# Patient Record
Sex: Male | Born: 1975 | Hispanic: No | Marital: Married | Smoking: Never smoker
Health system: Southern US, Community
[De-identification: ages and names within clinical notes are randomized; demographics above are authoritative.]

## PROBLEM LIST (undated history)

## (undated) DIAGNOSIS — E119 Type 2 diabetes mellitus without complications: Secondary | ICD-10-CM

## (undated) DIAGNOSIS — K529 Noninfective gastroenteritis and colitis, unspecified: Secondary | ICD-10-CM

## (undated) DIAGNOSIS — E785 Hyperlipidemia, unspecified: Secondary | ICD-10-CM

## (undated) HISTORY — DX: Hyperlipidemia, unspecified: E78.5

## (undated) HISTORY — DX: Noninfective gastroenteritis and colitis, unspecified: K52.9

## (undated) HISTORY — DX: Type 2 diabetes mellitus without complications: E11.9

---

## 2008-04-15 ENCOUNTER — Encounter: Admission: RE | Admit: 2008-04-15 | Discharge: 2008-04-15 | Payer: Self-pay | Admitting: Family Medicine

## 2010-08-06 IMAGING — US US SOFT TISSUE HEAD/NECK
1 series · 14 of 15 positions shown · non-contrast
Comparison: None

CLINICAL DATA: Goiter.

THYROID ULTRASOUND
TECHNIQUE: Ultrasound examination of the thyroid gland and
adjacent soft tissues was performed.

[Series 1: us soft tissue head/neck · 0.08mm/px · 14 of 15 slices shown]
[im 1/15]
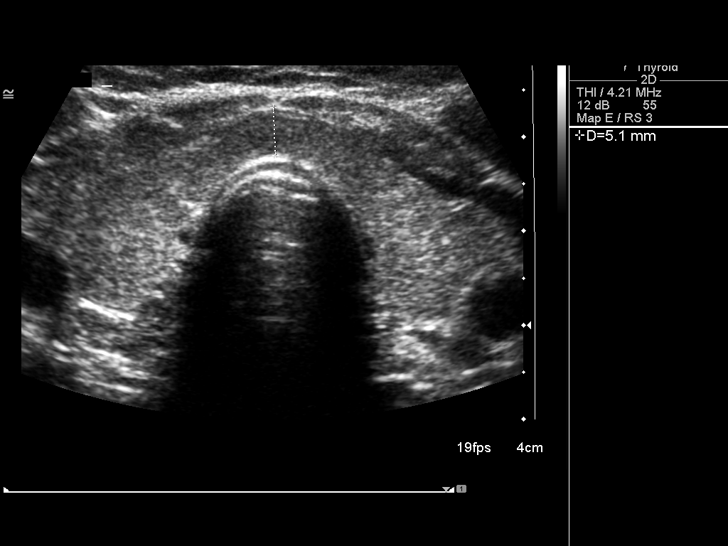
[im 2/15]
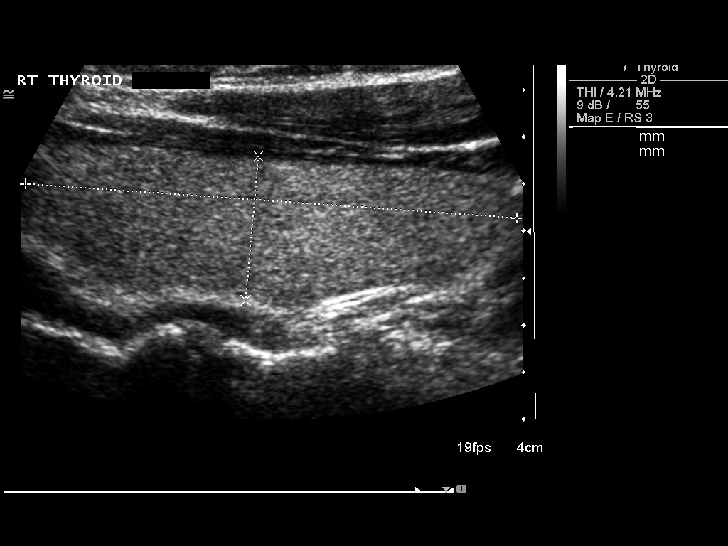
[im 3/15]
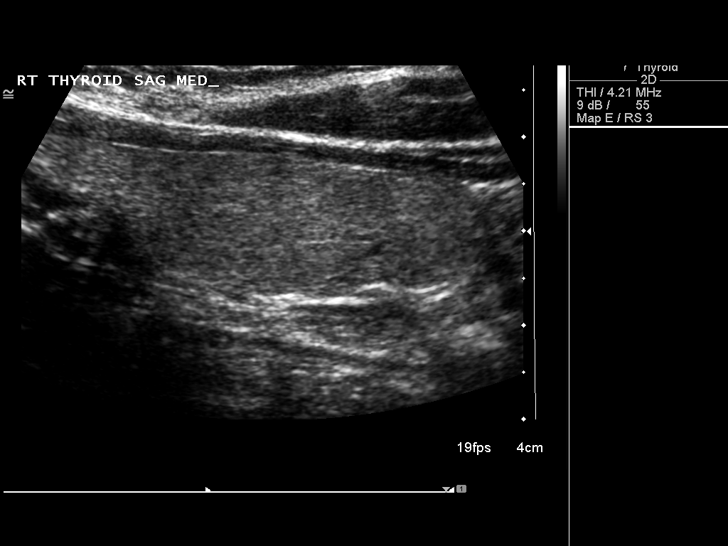
[im 4/15]
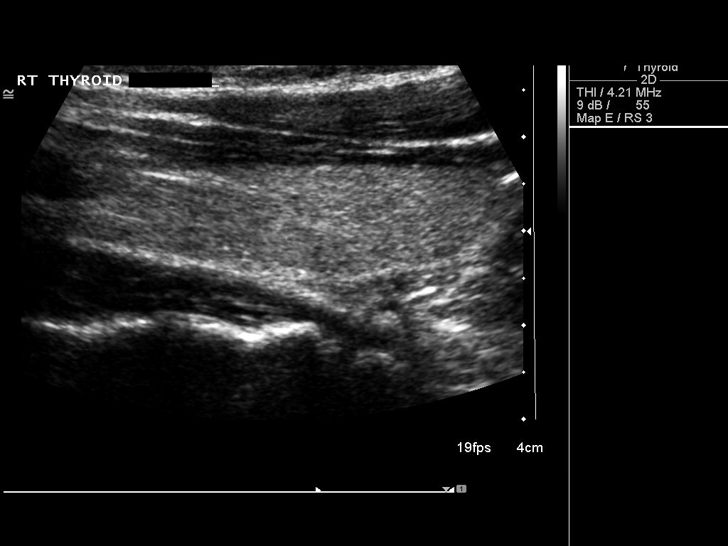
[im 5/15]
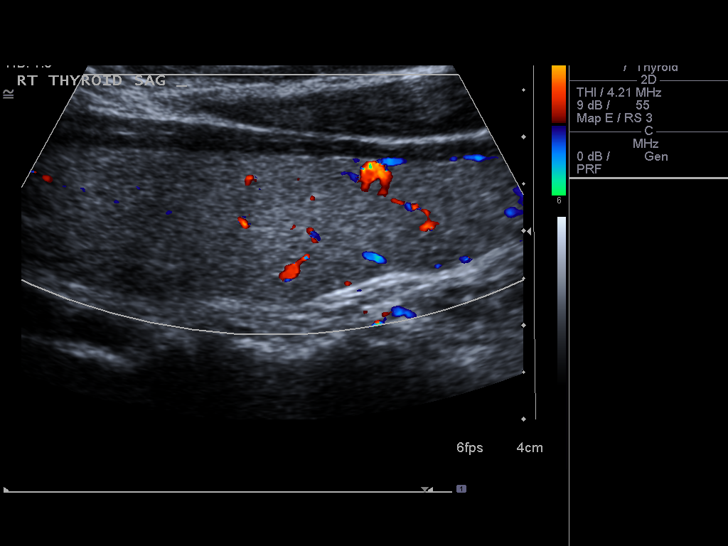
[im 6/15]
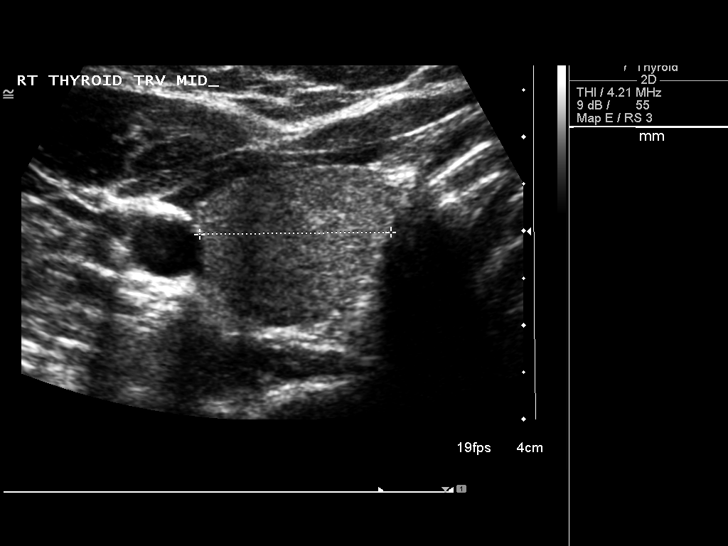
[im 7/15]
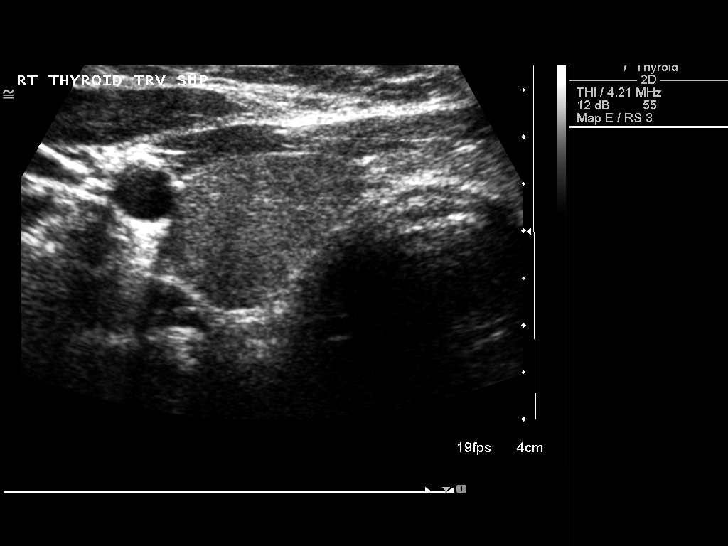
[im 9/15]
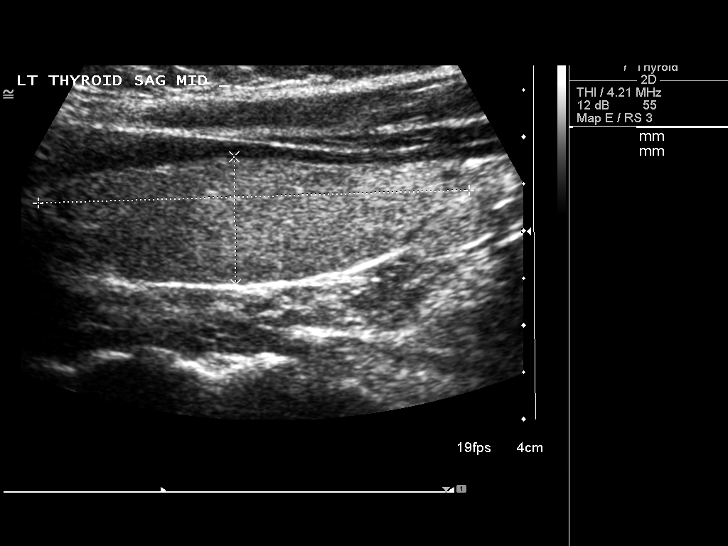
[im 10/15]
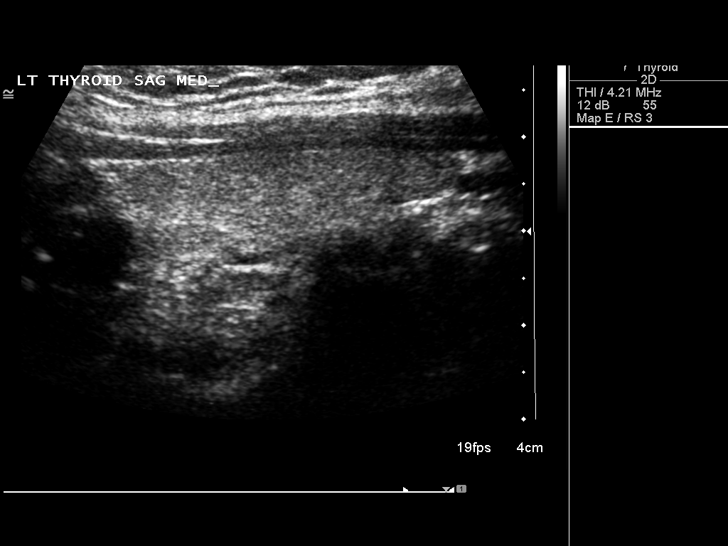
[im 11/15]
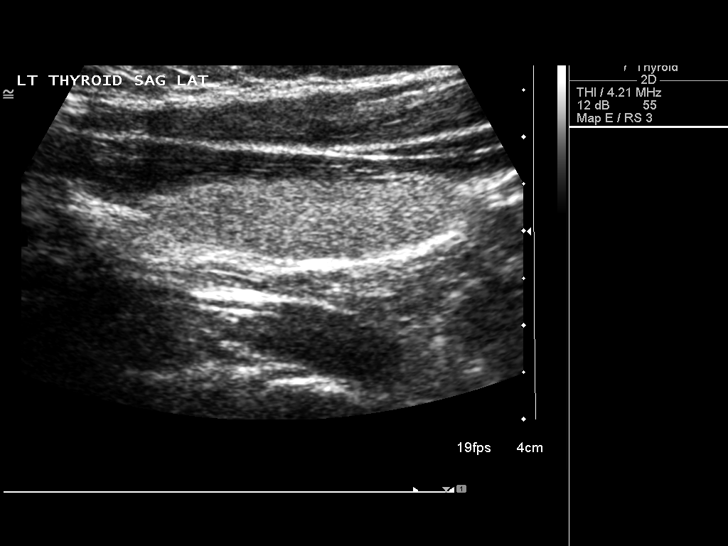
[im 12/15]
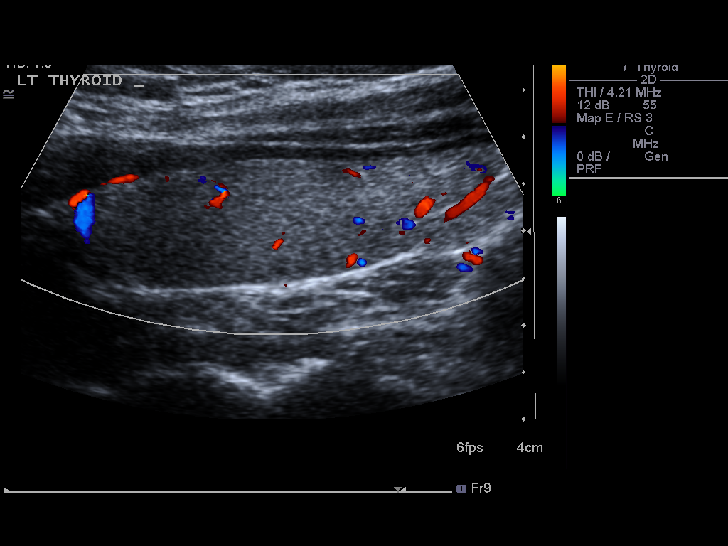
[im 13/15]
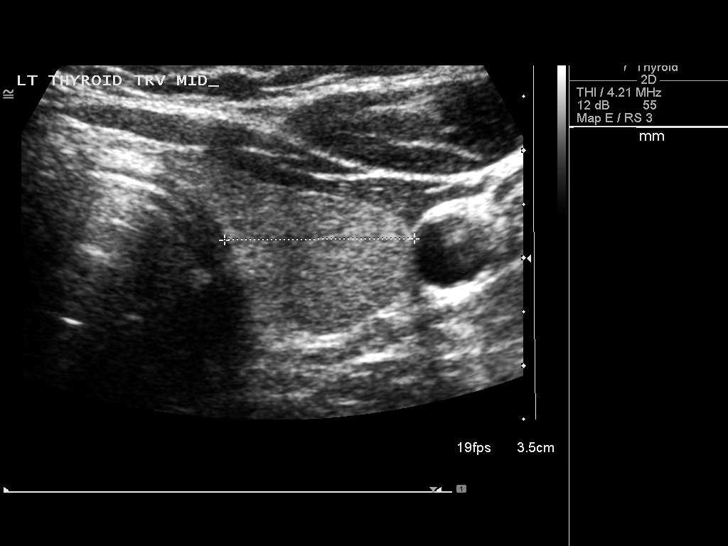
[im 14/15]
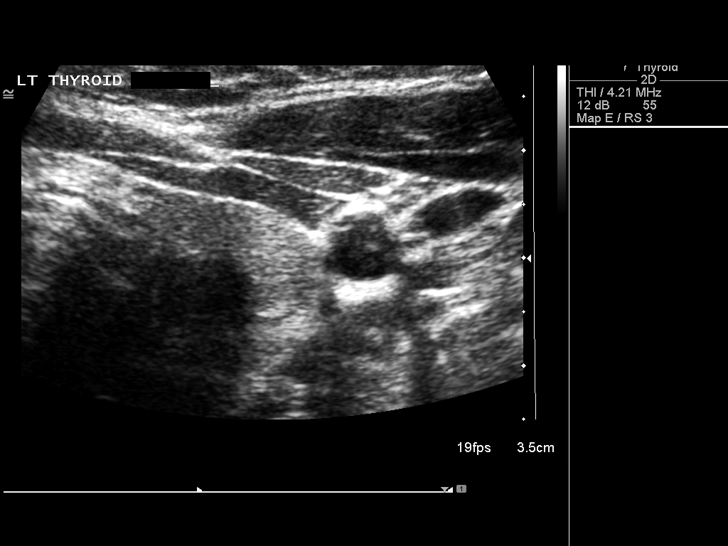
[im 15/15]
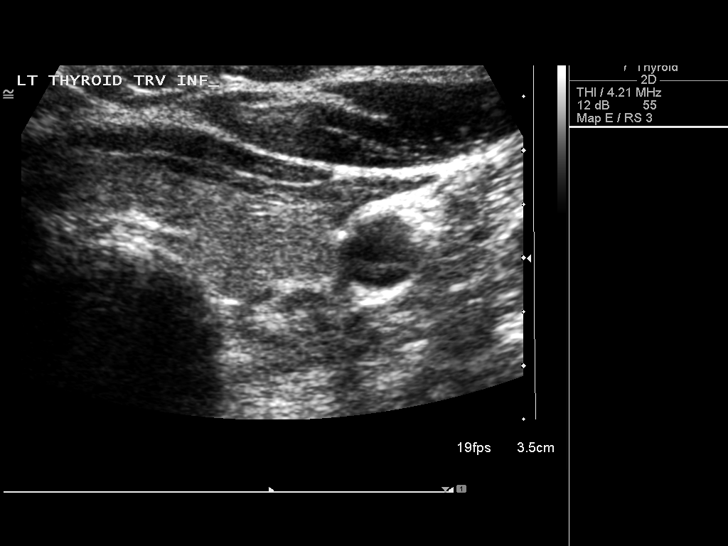

[14 of 15 positions shown; findings below may reference images not displayed]

FINDINGS: Right lobe of the thyroid measures 5.2 x 1.5 x 2.0 cm and
left lobe, 4.6 x 1.4 x 1.8 cm.  Isthmus measures 5 mm.  Thyroid
echotexture is homogeneous.
IMPRESSION: No thyroid nodules.

## 2011-03-26 ENCOUNTER — Ambulatory Visit (INDEPENDENT_AMBULATORY_CARE_PROVIDER_SITE_OTHER): Payer: No Typology Code available for payment source

## 2011-03-26 DIAGNOSIS — R05 Cough: Secondary | ICD-10-CM

## 2011-03-26 DIAGNOSIS — E119 Type 2 diabetes mellitus without complications: Secondary | ICD-10-CM

## 2011-03-26 DIAGNOSIS — R059 Cough, unspecified: Secondary | ICD-10-CM

## 2011-04-03 ENCOUNTER — Ambulatory Visit (INDEPENDENT_AMBULATORY_CARE_PROVIDER_SITE_OTHER): Payer: No Typology Code available for payment source

## 2011-04-03 DIAGNOSIS — J029 Acute pharyngitis, unspecified: Secondary | ICD-10-CM

## 2011-04-22 ENCOUNTER — Encounter: Payer: Self-pay | Admitting: Family Medicine

## 2011-07-03 ENCOUNTER — Other Ambulatory Visit: Payer: Self-pay | Admitting: Family Medicine

## 2011-08-03 ENCOUNTER — Other Ambulatory Visit: Payer: Self-pay | Admitting: Family Medicine

## 2011-08-04 ENCOUNTER — Other Ambulatory Visit: Payer: Self-pay | Admitting: Family Medicine

## 2011-09-13 ENCOUNTER — Other Ambulatory Visit: Payer: Self-pay | Admitting: Physician Assistant

## 2011-09-14 ENCOUNTER — Ambulatory Visit (INDEPENDENT_AMBULATORY_CARE_PROVIDER_SITE_OTHER): Payer: No Typology Code available for payment source | Admitting: Family Medicine

## 2011-09-14 VITALS — BP 122/83 | HR 76 | Temp 98.7°F | Resp 16 | Ht 69.0 in | Wt 183.6 lb

## 2011-09-14 DIAGNOSIS — E119 Type 2 diabetes mellitus without complications: Secondary | ICD-10-CM

## 2011-09-14 DIAGNOSIS — E7800 Pure hypercholesterolemia, unspecified: Secondary | ICD-10-CM

## 2011-09-14 LAB — POCT CBC
Lymph, poc: 3.4 (ref 0.6–3.4)
MCH, POC: 28.3 pg (ref 27–31.2)
MCHC: 32.2 g/dL (ref 31.8–35.4)
MID (cbc): 0.4 (ref 0–0.9)
MPV: 8.9 fL (ref 0–99.8)
POC MID %: 6.8 %M (ref 0–12)
Platelet Count, POC: 253 10*3/uL (ref 142–424)
RDW, POC: 13.9 %
WBC: 6.3 10*3/uL (ref 4.6–10.2)

## 2011-09-14 LAB — POCT GLYCOSYLATED HEMOGLOBIN (HGB A1C): Hemoglobin A1C: 7.2

## 2011-09-14 MED ORDER — PRAVASTATIN SODIUM 40 MG PO TABS: 40.0000 mg | ORAL_TABLET | Freq: Every day | ORAL | Status: DC

## 2011-09-14 MED ORDER — METFORMIN HCL 500 MG PO TABS: ORAL_TABLET | ORAL | Status: DC

## 2011-09-14 MED ORDER — LISINOPRIL 5 MG PO TABS: 5.0000 mg | ORAL_TABLET | Freq: Every day | ORAL | Status: DC

## 2011-09-14 NOTE — Progress Notes (Signed)
Patient Name: Alex Howe Date of Birth: 1975/06/28 Medical Record Number: [id number] Gender: male Date of Encounter: 09/14/2011  History of Present Illness:  Alex Howe is a 36 y.o. very pleasant male patient who presents with the following:  Here today to have labs and follow- up of DM and cholesterol.  He is still taking his medications, but he has not had (cholesterol) labs in over a year.   He is fasting today for labs except for just a little tea and almonds this morning.  He is exercising a lot more on the treadmill.  This sometimes makes his knees ache, but nothing serious.   Overall he is feeling well and has no other issues to report  There is no problem list on file for this patient.  No past medical history on file. No past surgical history on file. History  Substance Use Topics  . Smoking status: Never Smoker   . Smokeless tobacco: Not on file  . Alcohol Use: Not on file   No family history on file. No Known Allergies  Medication list has been reviewed and updated.  Prior to Admission medications   Medication Sig Start Date End Date Taking? Authorizing Provider  lisinopril (PRINIVIL,ZESTRIL) 5 MG tablet TAKE 1 TABLET BY MOUTH DAILY , NEEDS OFFICE VISIT/LABS 09/13/11  Yes Chelle S Jeffery, PA-C  metFORMIN (GLUCOPHAGE) 500 MG tablet TAKE ONE TABLET BY MOUTH EVERY MORNING AND 2 TABLETS BY MOUTH EVERY PM--NEEDS OFFICE VISIT/LABS 09/13/11  Yes Chelle S Jeffery, PA-C  pravastatin (PRAVACHOL) 40 MG tablet Take 1 tablet (40 mg total) by mouth daily. NEEDS OFFICE VISIT/LABS FOR MORE 09/13/11  Yes Chelle Tessa Lerner, PA-C    Review of Systems:  As per HPI- otherwise negative.   Physical Examination: Filed Vitals:   09/14/11 1325  BP: 122/83  Pulse: 76  Temp: 98.7 F (37.1 C)  Resp: 16   Filed Vitals:   09/14/11 1325  Height: 5\' 9"  (1.753 m)  Weight: 183 lb 9.6 oz (83.28 kg)   Body mass index is 27.11 kg/(m^2). Ideal Body Weight: Weight in (lb) to have BMI =  25: 168.9   GEN: WDWN, NAD, Non-toxic, A & O x 3 HEENT: Atraumatic, Normocephalic. Neck supple. No masses, No LAD.  PEERL Ears and Nose: No external deformity. CV: RRR, No M/G/R. No JVD. No thrill. No extra heart sounds. PULM: CTA B, no wheezes, crackles, rhonchi. No retractions. No resp. distress. No accessory muscle use. ABD: S, NT, ND, +BS. No rebound. No HSM. EXTR: No c/c/e NEURO Normal gait.  PSYCH: Normally interactive. Conversant. Not depressed or anxious appearing.  Calm demeanor.  Bilateral knees: some cracking and popping, but no heat, swelling, effusion or laxity. He declined x-ray today  Results for orders placed in visit on 09/14/11  POCT CBC      Component Value Range   WBC 6.3  4.6 - 10.2 K/uL   Lymph, poc 3.4  0.6 - 3.4   POC LYMPH PERCENT 54.1 (*) 10 - 50 %L   MID (cbc) 0.4  0 - 0.9   POC MID % 6.8  0 - 12 %M   POC Granulocyte 2.5  2 - 6.9   Granulocyte percent 39.1  37 - 80 %G   RBC 5.08  4.69 - 6.13 M/uL   Hemoglobin 14.4  14.1 - 18.1 g/dL   HCT, POC 28.4  13.2 - 53.7 %   MCV 87.9  80 - 97 fL   MCH, POC 28.3  27 - 31.2 pg   MCHC 32.2  31.8 - 35.4 g/dL   RDW, POC 14.7     Platelet Count, POC 253  142 - 424 K/uL   MPV 8.9  0 - 99.8 fL  POCT GLYCOSYLATED HEMOGLOBIN (HGB A1C)      Component Value Range   Hemoglobin A1C 7.2      Assessment and Plan: 1. Diabetes mellitus type II  POCT CBC, Comprehensive metabolic panel, POCT glycosylated hemoglobin (Hb A1C), metFORMIN (GLUCOPHAGE) 500 MG tablet, lisinopril (PRINIVIL,ZESTRIL) 5 MG tablet  2. High cholesterol  Lipid panel, pravastatin (PRAVACHOL) 40 MG tablet   Will increase his metformin dose slightly to 2000mg / day (had been on 1500).  Otherwise plan to follow- up pending his labs. Alex Howe is somewhat frustrated that he has not been able to lose weight.  Encouraged him to continue exercise, as it is helpful even if his weight does not change.    Remind re: eye exam with labs  Alex Pick, MD

## 2011-09-15 LAB — COMPREHENSIVE METABOLIC PANEL
ALT: 78 U/L — ABNORMAL HIGH (ref 0–53)
AST: 48 U/L — ABNORMAL HIGH (ref 0–37)
Alkaline Phosphatase: 67 U/L (ref 39–117)
Creat: 0.87 mg/dL (ref 0.50–1.35)
Total Bilirubin: 0.6 mg/dL (ref 0.3–1.2)

## 2011-09-15 LAB — LIPID PANEL
HDL: 39 mg/dL — ABNORMAL LOW (ref >39)
LDL Cholesterol: 138 mg/dL — ABNORMAL HIGH (ref 0–99)
Total CHOL/HDL Ratio: 5.5 Ratio

## 2011-09-17 NOTE — Addendum Note (Signed)
Addended by: Abbe Amsterdam C on: 09/17/2011 05:19 PM   Modules accepted: Orders

## 2011-11-03 ENCOUNTER — Ambulatory Visit (INDEPENDENT_AMBULATORY_CARE_PROVIDER_SITE_OTHER): Payer: No Typology Code available for payment source | Admitting: Family Medicine

## 2011-11-03 VITALS — BP 118/68 | HR 80 | Temp 99.0°F | Resp 17 | Ht 69.0 in | Wt 180.0 lb

## 2011-11-03 DIAGNOSIS — E119 Type 2 diabetes mellitus without complications: Secondary | ICD-10-CM

## 2011-11-03 DIAGNOSIS — R899 Unspecified abnormal finding in specimens from other organs, systems and tissues: Secondary | ICD-10-CM

## 2011-11-03 DIAGNOSIS — R6889 Other general symptoms and signs: Secondary | ICD-10-CM

## 2011-11-03 LAB — HEPATIC FUNCTION PANEL
Albumin: 4.8 g/dL (ref 3.5–5.2)
Alkaline Phosphatase: 65 U/L (ref 39–117)
Total Protein: 7.9 g/dL (ref 6.0–8.3)

## 2011-11-03 LAB — POCT GLYCOSYLATED HEMOGLOBIN (HGB A1C): Hemoglobin A1C: 6.7

## 2011-11-03 NOTE — Progress Notes (Signed)
[facility] [address], [place] [address] [phone] 0000  Date:  11/03/2011   Name:  Vinayak Bobier   DOB:  1975-11-18   MRN:  [id number]  PCP:  Abbe Amsterdam, MD    Chief Complaint: Follow-up   History of Present Illness:  Alex Howe is a 36 y.o. very pleasant male patient who presents with the following:  Needs to have his LFTs checked again- we were surprised to see mild elevation of his AST and ALT in June.  His LFTs are usually fine.  We would like to increase his pravachol, but need to ensure that his LFTs are normal first.  He continues to work on his diet and exercise, and feel that he is doing well from a glucose standpoint.    There is no problem list on file for this patient.   No past medical history on file.  No past surgical history on file.  History  Substance Use Topics  . Smoking status: Never Smoker   . Smokeless tobacco: Not on file  . Alcohol Use: Not on file    No family history on file.  No Known Allergies  Medication list has been reviewed and updated.  Current Outpatient Prescriptions on File Prior to Visit  Medication Sig Dispense Refill  . lisinopril (PRINIVIL,ZESTRIL) 5 MG tablet Take 1 tablet (5 mg total) by mouth daily.  90 tablet  3  . metFORMIN (GLUCOPHAGE) 500 MG tablet Take 2 in the morning and 2 at night  360 tablet  3  . pravastatin (PRAVACHOL) 40 MG tablet Take 1 tablet (40 mg total) by mouth daily.  90 tablet  3    Review of Systems:  As per HPI- otherwise negative.   Physical Examination: Filed Vitals:   11/03/11 1436  BP: 118/68  Pulse: 80  Temp: 99 F (37.2 C)  Resp: 17   Filed Vitals:   11/03/11 1436  Height: 5\' 9"  (1.753 m)  Weight: 180 lb (81.647 kg)   Body mass index is 26.58 kg/(m^2). Ideal Body Weight: Weight in (lb) to have BMI = 25: 168.9    GEN: WDWN, NAD, Non-toxic, Alert & Oriented x 3 HEENT: Atraumatic, Normocephalic.  Ears and Nose: No external deformity. EXTR: No  clubbing/cyanosis/edema NEURO: Normal gait.  PSYCH: Normally interactive. Conversant. Not depressed or anxious appearing.  Calm demeanor.  No abdominal tenderness or hepatomegaly  Results for orders placed in visit on 11/03/11  POCT GLYCOSYLATED HEMOGLOBIN (HGB A1C)      Component Value Range   Hemoglobin A1C 6.7       Assessment and Plan: 1. Abnormal laboratory test  Hepatic function panel  2. DM (diabetes mellitus)  POCT glycosylated hemoglobin (Hb A1C)    A1c looks fine, await LFT panel Caramia Boutin, MD

## 2011-11-04 ENCOUNTER — Encounter: Payer: Self-pay | Admitting: Family Medicine

## 2011-12-02 ENCOUNTER — Telehealth: Payer: Self-pay

## 2011-12-02 MED ORDER — PRAVASTATIN SODIUM 80 MG PO TABS: 80.0000 mg | ORAL_TABLET | Freq: Every day | ORAL | Status: DC

## 2011-12-02 NOTE — Telephone Encounter (Signed)
Called patient advised this is sent in (per letter from Dr Copland 80 mg dose) for him and he is to come in to be seen in 2 months for follow up labs.

## 2011-12-02 NOTE — Telephone Encounter (Signed)
[facility] on [address] and [address] told patient to contact Dr. Patsy Lager for his pravastatin  [phone]

## 2012-02-27 ENCOUNTER — Other Ambulatory Visit: Payer: Self-pay | Admitting: Physician Assistant

## 2012-04-18 ENCOUNTER — Other Ambulatory Visit: Payer: Self-pay | Admitting: Physician Assistant

## 2012-05-16 ENCOUNTER — Other Ambulatory Visit: Payer: Self-pay | Admitting: Physician Assistant

## 2012-07-16 ENCOUNTER — Other Ambulatory Visit: Payer: Self-pay | Admitting: Physician Assistant

## 2012-07-28 ENCOUNTER — Ambulatory Visit (INDEPENDENT_AMBULATORY_CARE_PROVIDER_SITE_OTHER): Payer: PRIVATE HEALTH INSURANCE | Admitting: Family Medicine

## 2012-07-28 ENCOUNTER — Other Ambulatory Visit: Payer: Self-pay | Admitting: Family Medicine

## 2012-07-28 VITALS — BP 112/86 | HR 82 | Temp 98.8°F | Resp 16 | Ht 70.5 in | Wt 186.0 lb

## 2012-07-28 DIAGNOSIS — E119 Type 2 diabetes mellitus without complications: Secondary | ICD-10-CM

## 2012-07-28 DIAGNOSIS — E7800 Pure hypercholesterolemia, unspecified: Secondary | ICD-10-CM

## 2012-07-28 DIAGNOSIS — J029 Acute pharyngitis, unspecified: Secondary | ICD-10-CM

## 2012-07-28 DIAGNOSIS — R51 Headache: Secondary | ICD-10-CM

## 2012-07-28 LAB — COMPREHENSIVE METABOLIC PANEL
Albumin: 4.6 g/dL (ref 3.5–5.2)
CO2: 28 mEq/L (ref 19–32)
Calcium: 10 mg/dL (ref 8.4–10.5)
Chloride: 102 mEq/L (ref 96–112)
Glucose, Bld: 109 mg/dL — ABNORMAL HIGH (ref 70–99)
Potassium: 4.6 mEq/L (ref 3.5–5.3)
Sodium: 137 mEq/L (ref 135–145)
Total Protein: 7.9 g/dL (ref 6.0–8.3)

## 2012-07-28 LAB — POCT CBC
Granulocyte percent: 49.2 %G (ref 37–80)
HCT, POC: 45.4 % (ref 43.5–53.7)
MCV: 90.9 fL (ref 80–97)
POC LYMPH PERCENT: 43 %L (ref 10–50)
RDW, POC: 13.2 %

## 2012-07-28 LAB — TSH: TSH: 2.391 u[IU]/mL (ref 0.350–4.500)

## 2012-07-28 LAB — LDL CHOLESTEROL, DIRECT: Direct LDL: 123 mg/dL — ABNORMAL HIGH

## 2012-07-28 MED ORDER — TRAMADOL HCL 50 MG PO TABS: 50.0000 mg | ORAL_TABLET | Freq: Three times a day (TID) | ORAL | Status: DC | PRN

## 2012-07-28 MED ORDER — PRAVASTATIN SODIUM 80 MG PO TABS: 80.0000 mg | ORAL_TABLET | Freq: Every day | ORAL | Status: AC

## 2012-07-28 MED ORDER — LISINOPRIL 5 MG PO TABS: 5.0000 mg | ORAL_TABLET | Freq: Every day | ORAL | Status: AC

## 2012-07-28 MED ORDER — PENICILLIN V POTASSIUM 500 MG PO TABS: 500.0000 mg | ORAL_TABLET | Freq: Two times a day (BID) | ORAL | Status: DC

## 2012-07-28 MED ORDER — METFORMIN HCL 500 MG PO TABS: ORAL_TABLET | ORAL | Status: AC

## 2012-07-28 NOTE — Progress Notes (Signed)
[facility] [address] [place] [address] [phone] 0000  Date:  07/28/2012   Name:  Alex Howe   DOB:  05-16-1975   MRN:  [id number]  PCP:  Abbe Amsterdam, MD    Chief Complaint: Sore Throat   History of Present Illness:  Alex Howe is a 37 y.o. very pleasant male patient who presents with the following:  He is concerned about a ST- he has noted inflammation and pain in his throat, and it hurts to swallow.  He has noted this for the last week or so.  He started taking some amoxicillin 500 TID about 3 days ago that he had left over.    He does have a history of goiter with normal TSH and normal ultrasound.   He has not noted a fever.  No runny nose, no sneezing.   No cough.    He has a history of migraine HA, and has noted over the last few months that tylenol is not controlling his pain.  The HA is not particularly severe, and it feels like pressure in both sides of his head (as opposed to the severe, unilateal pain of his usual migraine).  He wonders if he might have allergies contributing to his pain.    He will take NSIADs as well sometimes for the HA and they do help some.      His glucose was 140 yesterday (fasting).  He may get up to 160 or so after eating.  We have not seen him for DM in about 9 months.    He is not fasting today.  Needs RF of his medications   There are no active problems to display for this patient.   No past medical history on file.  No past surgical history on file.  History  Substance Use Topics  . Smoking status: Never Smoker   . Smokeless tobacco: Not on file  . Alcohol Use: Not on file    No family history on file.  No Known Allergies  Medication list has been reviewed and updated.  Current Outpatient Prescriptions on File Prior to Visit  Medication Sig Dispense Refill  . lisinopril (PRINIVIL,ZESTRIL) 5 MG tablet Take 1 tablet (5 mg total) by mouth daily.  90 tablet  3  . metFORMIN (GLUCOPHAGE) 500 MG  tablet Take 2 in the morning and 2 at night  360 tablet  3  . pravastatin (PRAVACHOL) 80 MG tablet TAKE 1 TABLET BY MOUTH EVERY DAY  30 tablet  0   No current facility-administered medications on file prior to visit.    Review of Systems:  As per HPI- otherwise negative.   Physical Examination: Filed Vitals:   07/28/12 1316  BP: 112/86  Pulse: 82  Temp: 98.8 F (37.1 C)  Resp: 16   Filed Vitals:   07/28/12 1316  Height: 5' 10.5" (1.791 m)  Weight: 186 lb (84.369 kg)   Body mass index is 26.3 kg/(m^2). Ideal Body Weight: Weight in (lb) to have BMI = 25: 176.4  GEN: WDWN, NAD, Non-toxic, A & O x 3 HEENT: Atraumatic, Normocephalic. Neck supple. No masses, No LAD.  Bilateral TM wnl, oropharynx normal- no exudate.  PEERL,EOMI.  Nasal cavity is inflamed Ears and Nose: No external deformity. CV: RRR, No M/G/R. No JVD. No thrill. No extra heart sounds. PULM: CTA B, no wheezes, crackles, rhonchi. No retractions. No resp. distress. No accessory muscle use. ABD: S, NT, ND, +BS. No rebound. No HSM. EXTR: No  c/c/e NEURO Normal gait.  PSYCH: Normally interactive. Conversant. Not depressed or anxious appearing.  Calm demeanor.   Results for orders placed in visit on 07/28/12  POCT CBC      Result Value Range   WBC 6.7  4.6 - 10.2 K/uL   Lymph, poc 2.9  0.6 - 3.4   POC LYMPH PERCENT 43.0  10 - 50 %L   MID (cbc) 0.5  0 - 0.9   POC MID % 7.8  0 - 12 %M   POC Granulocyte 3.3  2 - 6.9   Granulocyte percent 49.2  37 - 80 %G   RBC 5.00  4.69 - 6.13 M/uL   Hemoglobin 14.5  14.1 - 18.1 g/dL   HCT, POC 78.4  69.6 - 53.7 %   MCV 90.9  80 - 97 fL   MCH, POC 29.0  27 - 31.2 pg   MCHC 31.9  31.8 - 35.4 g/dL   RDW, POC 29.5     Platelet Count, POC 227  142 - 424 K/uL   MPV 9.2  0 - 99.8 fL  POCT GLYCOSYLATED HEMOGLOBIN (HGB A1C)      Result Value Range   Hemoglobin A1C 6.9    POCT RAPID STREP A (OFFICE)      Result Value Range   Rapid Strep A Screen Negative  Negative      Assessment and Plan: Pharyngitis - Plan: POCT rapid strep A, penicillin v potassium (VEETID) 500 MG tablet  Type II or unspecified type diabetes mellitus without mention of complication, not stated as uncontrolled - Plan: POCT CBC, POCT glycosylated hemoglobin (Hb A1C), Comprehensive metabolic panel, TSH, LDL cholesterol, direct, lisinopril (PRINIVIL,ZESTRIL) 5 MG tablet, metFORMIN (GLUCOPHAGE) 500 MG tablet  High cholesterol - Plan: pravastatin (PRAVACHOL) 80 MG tablet  Headache - Plan: traMADol (ULTRAM) 50 MG tablet  Possibly partially treated strep throat.  Will give a full course of penicillin as his rapid strep may be altered by taking amox for the last several days.  Refilled medications.  Await labs- Will plan further follow- up pending labs. Headaches- suspect he may have an element of allergies.  Will try an OTC antihistamine.  Did give some tramadol to use if his sinus pain is more severe.     Signed Abbe Amsterdam, MD

## 2012-07-28 NOTE — Patient Instructions (Addendum)
Use the penicillin for possible strep throat.  I will be in touch with the rest of your labs in a few days. Let me know if your throat does not get better.  Try some chloraseptic throat spray for your throat pain.    I suspect your headaches may be related to allergies.  Try some OTC claritin or zyrtec.  You can also use the tramadol as needed for headache. However, this medication can cause sleepiness, and could possibly increase your risk of a seizure while you are taking the penicillin.  If your headaches do not improve please let me know.

## 2012-08-02 ENCOUNTER — Encounter: Payer: Self-pay | Admitting: Family Medicine

## 2012-08-02 NOTE — Progress Notes (Signed)
Paper chart in your box

## 2012-08-03 ENCOUNTER — Telehealth: Payer: Self-pay

## 2012-08-03 ENCOUNTER — Telehealth: Payer: Self-pay | Admitting: Family Medicine

## 2012-08-03 NOTE — Telephone Encounter (Signed)
Sure that is fine thanks

## 2012-08-03 NOTE — Telephone Encounter (Signed)
Spoke with Robin at [facility]. Test added.  

## 2012-08-03 NOTE — Telephone Encounter (Signed)
Message copied by Johnnette Litter on Thu Aug 03, 2012  6:26 PM ------      Message from: Abbe Amsterdam C      Created: Thu Aug 03, 2012  4:13 PM       Can we add a chronic hep panel to his blood?       Dx: elevated LFTs.  Thanks!  ------

## 2012-08-03 NOTE — Telephone Encounter (Signed)
Is it ok to do an acute panel? Ins won't pay for a chronic panel

## 2012-08-03 NOTE — Telephone Encounter (Signed)
Sure, that is fine, thanks

## 2012-08-03 NOTE — Telephone Encounter (Signed)
Called to go over his labs.  His LFTs are slightly up. Will refer for a hepatic ultrasound and check hepatitis panel. Follow-up pending these results, and plan recheck in about 3 months

## 2012-08-08 ENCOUNTER — Ambulatory Visit
Admission: RE | Admit: 2012-08-08 | Discharge: 2012-08-08 | Disposition: A | Discharge status: Home / Self Care | Payer: PRIVATE HEALTH INSURANCE | Source: Ambulatory Visit | Attending: Family Medicine | Admitting: Family Medicine

## 2012-08-11 ENCOUNTER — Telehealth: Payer: Self-pay

## 2012-08-11 NOTE — Telephone Encounter (Signed)
Called and went over labs- negative for hepatitis and ultrasound showed fatty liver.  He will plan to recheck LFTs in about 3 months

## 2012-08-11 NOTE — Telephone Encounter (Signed)
Called him to get more information, he is asking for the lab results. I advised him some have just resulted, will ask Dr copland to review.

## 2012-08-11 NOTE — Telephone Encounter (Signed)
Pt is calling to speak to Dr Patsy Lager Call back number is [phone]

## 2012-08-12 ENCOUNTER — Telehealth: Payer: Self-pay | Admitting: Family Medicine

## 2012-08-12 NOTE — Telephone Encounter (Signed)
Spoke with Alex Howe yesterday- let him know that his ultrasound was ok except for fatty liver.  His hepatitis screen was negative. He has had mild elevation of his LFTs in the past, never severe or persistent.  We will plan to recheck his LFTs in about 3 months.

## 2012-08-23 ENCOUNTER — Encounter: Payer: Self-pay | Admitting: Family Medicine

## 2012-08-24 ENCOUNTER — Telehealth: Payer: Self-pay | Admitting: Radiology

## 2012-08-24 NOTE — Telephone Encounter (Signed)
Called patient about lab letter/ [place] letter from Dr Patsy Lager. He is agreeable to the GI referral and will expect their call.

## 2012-11-02 ENCOUNTER — Ambulatory Visit (INDEPENDENT_AMBULATORY_CARE_PROVIDER_SITE_OTHER): Payer: No Typology Code available for payment source | Admitting: Family Medicine

## 2012-11-02 VITALS — BP 126/76 | HR 101 | Temp 98.1°F | Resp 18 | Ht 69.5 in | Wt 174.8 lb

## 2012-11-02 DIAGNOSIS — K529 Noninfective gastroenteritis and colitis, unspecified: Secondary | ICD-10-CM

## 2012-11-02 DIAGNOSIS — K5289 Other specified noninfective gastroenteritis and colitis: Secondary | ICD-10-CM

## 2012-11-02 DIAGNOSIS — R197 Diarrhea, unspecified: Secondary | ICD-10-CM

## 2012-11-02 DIAGNOSIS — R112 Nausea with vomiting, unspecified: Secondary | ICD-10-CM

## 2012-11-02 DIAGNOSIS — E119 Type 2 diabetes mellitus without complications: Secondary | ICD-10-CM

## 2012-11-02 LAB — POCT CBC
Lymph, poc: 1.9 (ref 0.6–3.4)
MCH, POC: 29.3 pg (ref 27–31.2)
MCHC: 32.2 g/dL (ref 31.8–35.4)
MPV: 10.5 fL (ref 0–99.8)
POC Granulocyte: 11.4 — AB (ref 2–6.9)
POC LYMPH PERCENT: 13.5 %L (ref 10–50)
POC MID %: 3.8 %M (ref 0–12)
RDW, POC: 12.9 %

## 2012-11-02 LAB — AMYLASE: Amylase: 44 U/L (ref 0–105)

## 2012-11-02 LAB — COMPREHENSIVE METABOLIC PANEL
ALT: 40 U/L (ref 0–53)
Alkaline Phosphatase: 77 U/L (ref 39–117)
Creat: 0.94 mg/dL (ref 0.50–1.35)
Glucose, Bld: 176 mg/dL — ABNORMAL HIGH (ref 70–99)
Sodium: 137 mEq/L (ref 135–145)
Total Bilirubin: 0.8 mg/dL (ref 0.3–1.2)
Total Protein: 8.5 g/dL — ABNORMAL HIGH (ref 6.0–8.3)

## 2012-11-02 MED ORDER — ONDANSETRON 8 MG PO TBDP: 8.0000 mg | ORAL_TABLET | Freq: Three times a day (TID) | ORAL | Status: AC | PRN

## 2012-11-02 NOTE — Patient Instructions (Addendum)
Viral Gastroenteritis Viral gastroenteritis is also known as stomach flu. This condition affects the stomach and intestinal tract. It can cause sudden diarrhea and vomiting. The illness typically lasts 3 to 8 days. Most people develop an immune response that eventually gets rid of the virus. While this natural response develops, the virus can make you quite ill. CAUSES  Many different viruses can cause gastroenteritis, such as rotavirus or noroviruses. You can catch one of these viruses by consuming contaminated food or water. You may also catch a virus by sharing utensils or other personal items with an infected person or by touching a contaminated surface. SYMPTOMS  The most common symptoms are diarrhea and vomiting. These problems can cause a severe loss of body fluids (dehydration) and a body salt (electrolyte) imbalance. Other symptoms may include:  Fever.  Headache.  Fatigue.  Abdominal pain. DIAGNOSIS  Your caregiver can usually diagnose viral gastroenteritis based on your symptoms and a physical exam. A stool sample may also be taken to test for the presence of viruses or other infections. TREATMENT  This illness typically goes away on its own. Treatments are aimed at rehydration. The most serious cases of viral gastroenteritis involve vomiting so severely that you are not able to keep fluids down. In these cases, fluids must be given through an intravenous line (IV). HOME CARE INSTRUCTIONS   Drink enough fluids to keep your urine clear or pale yellow. Drink small amounts of fluids frequently and increase the amounts as tolerated.  Ask your caregiver for specific rehydration instructions.  Avoid:  Foods high in sugar.  Alcohol.  Carbonated drinks.  Tobacco.  Juice.  Caffeine drinks.  Extremely hot or cold fluids.  Fatty, greasy foods.  Too much intake of anything at one time.  Dairy products until 24 to 48 hours after diarrhea stops.  You may consume probiotics.  Probiotics are active cultures of beneficial bacteria. They may lessen the amount and number of diarrheal stools in adults. Probiotics can be found in yogurt with active cultures and in supplements.  Wash your hands well to avoid spreading the virus.  Only take over-the-counter or prescription medicines for pain, discomfort, or fever as directed by your caregiver. Do not give aspirin to children. Antidiarrheal medicines are not recommended.  Ask your caregiver if you should continue to take your regular prescribed and over-the-counter medicines.  Keep all follow-up appointments as directed by your caregiver. SEEK IMMEDIATE MEDICAL CARE IF:   You are unable to keep fluids down.  You do not urinate at least once every 6 to 8 hours.  You develop shortness of breath.  You notice blood in your stool or vomit. This may look like coffee grounds.  You have abdominal pain that increases or is concentrated in one small area (localized).  You have persistent vomiting or diarrhea.  You have a fever.  The patient is a child younger than 3 months, and he or she has a fever.  The patient is a child older than 3 months, and he or she has a fever and persistent symptoms.  The patient is a child older than 3 months, and he or she has a fever and symptoms suddenly get worse.  The patient is a baby, and he or she has no tears when crying. MAKE SURE YOU:   Understand these instructions.  Will watch your condition.  Will get help right away if you are not doing well or get worse. Document Released: 03/08/2005 Document Revised: 05/31/2011 Document Reviewed: 12/23/2010   [employer] Patient Information 2014 [employer]  

## 2012-11-02 NOTE — Addendum Note (Signed)
Addended by: Cira Rue R on: 11/02/2012 11:39 AM   Modules accepted: Orders

## 2012-11-02 NOTE — Progress Notes (Signed)
[address]   [place], [place]  [address]   [phone]  Subjective:    Patient ID: Alex Howe, male    DOB: 10-28-1975, 37 y.o.   MRN: [id number]  HPI This 37 y.o. male presents for evaluation of abdominal pain and bloating, vomiting.  Onset one day ago.  The day before, ate fish and wine.  No fever/chills/sweats.  Mild headache.  No sore throat, ear pain, rhinorrhea, cough.  Vomited x 3; food contents; non-bloody.  Diarrhea x 2; non-bloody; non-mucous.  Abdominal pain and bloating before vomiting; then resolved.  No previous abdominal surgeries.  No recent travel; no antibiotics; no camping.  Two coworkers ill currently with similar symptoms.  Works in [employer] in [employer].  PCP: Copland.   Review of Systems  Constitutional: Negative for fever, chills, diaphoresis and fatigue.  HENT: Negative for ear pain, congestion, sore throat, rhinorrhea and postnasal drip.   Respiratory: Negative for cough and shortness of breath.   Cardiovascular: Negative for chest pain.  Gastrointestinal: Positive for nausea, vomiting, abdominal pain, diarrhea and abdominal distention. Negative for constipation.  Endocrine: Negative for polydipsia, polyphagia and polyuria.  Neurological: Positive for numbness and headaches. Negative for dizziness.    Past Medical History  Diagnosis Date  . Diabetes mellitus without complication   . Hyperlipidemia     History reviewed. No pertinent past surgical history.  Prior to Admission medications   Medication Sig Start Date End Date Taking? Authorizing Provider  lisinopril (PRINIVIL,ZESTRIL) 5 MG tablet Take 1 tablet (5 mg total) by mouth daily. 07/28/12  Yes Gwenlyn Found Copland, MD  metFORMIN (GLUCOPHAGE) 500 MG tablet Take 2 in the morning and 2 at night 07/28/12  Yes Jessica C Copland, MD  pravastatin (PRAVACHOL) 80 MG tablet Take 1 tablet (80 mg total) by mouth daily. 07/28/12  Yes Gwenlyn Found Copland, MD  penicillin v potassium (VEETID) 500 MG tablet Take 1 tablet  (500 mg total) by mouth 2 (two) times daily. 07/28/12   Gwenlyn Found Copland, MD  traMADol (ULTRAM) 50 MG tablet Take 1 tablet (50 mg total) by mouth every 8 (eight) hours as needed for pain. 07/28/12   Pearline Cables, MD    No Known Allergies  History   Social History  . Marital Status: Married    Spouse Name: N/A    Number of Children: N/A  . Years of Education: N/A   Occupational History  . Not on file.   Social History Main Topics  . Smoking status: Never Smoker   . Smokeless tobacco: Not on file  . Alcohol Use: No  . Drug Use: No  . Sexual Activity: Not on file   Other Topics Concern  . Not on file   Social History Narrative  . No narrative on file    History reviewed. No pertinent family history.     Objective:   Physical Exam  Nursing note and vitals reviewed. Constitutional: He is oriented to person, place, and time. He appears well-developed and well-nourished. No distress.  HENT:  Head: Normocephalic and atraumatic.  Mouth/Throat: Oropharynx is clear and moist.  Eyes: Conjunctivae and EOM are normal. Pupils are equal, round, and reactive to light.  Neck: Normal range of motion. Neck supple. No thyromegaly present.  Cardiovascular: Normal rate and regular rhythm.  Exam reveals no gallop and no friction rub.   No murmur heard. Rate 98.  Pulmonary/Chest: Effort normal and breath sounds normal. He has no wheezes. He has no rales.  Abdominal:  Soft. Bowel sounds are normal. He exhibits no distension and no mass. There is no hepatosplenomegaly. There is tenderness. There is no rebound and no guarding.  Diffuse TTP throughout; no g/r.  Lymphadenopathy:    He has no cervical adenopathy.  Neurological: He is alert and oriented to person, place, and time.  Skin: Skin is warm and dry. No rash noted. He is not diaphoretic.  Psychiatric: He has a normal mood and affect. His behavior is normal. Judgment and thought content normal.   Results for orders placed in visit on  11/02/12  POCT CBC      Result Value Range   WBC 13.8 (*) 4.6 - 10.2 K/uL   Lymph, poc 1.9  0.6 - 3.4   POC LYMPH PERCENT 13.5  10 - 50 %L   MID (cbc) 0.5  0 - 0.9   POC MID % 3.8  0 - 12 %M   POC Granulocyte 11.4 (*) 2 - 6.9   Granulocyte percent 82.7 (*) 37 - 80 %G   RBC 4.91  4.69 - 6.13 M/uL   Hemoglobin 14.4  14.1 - 18.1 g/dL   HCT, POC 96.0  45.4 - 53.7 %   MCV 91.0  80 - 97 fL   MCH, POC 29.3  27 - 31.2 pg   MCHC 32.2  31.8 - 35.4 g/dL   RDW, POC 09.8     Platelet Count, POC 209  142 - 424 K/uL   MPV 10.5  0 - 99.8 fL       Assessment & Plan:  Nausea with vomiting - Plan: POCT CBC, POCT urinalysis dipstick, Comprehensive metabolic panel, Lipase, Amylase, ondansetron (ZOFRAN-ODT) 8 MG disintegrating tablet, CANCELED: POCT UA - Microscopic Only  Diarrhea - Plan: POCT CBC, POCT urinalysis dipstick, Comprehensive metabolic panel, Lipase, Amylase, CANCELED: POCT UA - Microscopic Only  Gastroenteritis  Type II or unspecified type diabetes mellitus without mention of complication, not stated as uncontrolled   1.  Gastroenteritis:  New. Onset in past 24 hours.  BRAT diet, hydrate.  Rx for Zofran provided.  RTC for inability to keep down fluids; RTC for worsening abdominal pain or v/d. 2.  DMII:  Stable; obtain labs; monitor sugars daily while acutely ill.  Meds ordered this encounter  Medications  . ondansetron (ZOFRAN-ODT) 8 MG disintegrating tablet    Sig: Take 1 tablet (8 mg total) by mouth every 8 (eight) hours as needed for nausea.    Dispense:  30 tablet    Refill:  0

## 2012-11-10 ENCOUNTER — Encounter: Payer: Self-pay | Admitting: Gastroenterology

## 2012-11-16 ENCOUNTER — Encounter: Payer: Self-pay | Admitting: *Deleted

## 2012-11-21 ENCOUNTER — Ambulatory Visit: Payer: PRIVATE HEALTH INSURANCE | Admitting: Gastroenterology

## 2013-12-25 ENCOUNTER — Telehealth: Payer: Self-pay

## 2013-12-25 NOTE — Telephone Encounter (Signed)
Clld pt - advsd of need to schedule Diabetic Care Follow up appt. Pt advsd he [place] and will find PCP there to follow his diabetes.

## 2014-11-29 IMAGING — US US ABDOMEN LIMITED
1 series · 14 of 25 positions shown · non-contrast
Comparison: None.

CLINICAL DATA: Elevated liver function tests

LIMITED ABDOMINAL ULTRASOUND - RIGHT UPPER QUADRANT

[Series 1: us abdomen limited · 0.28mm/px · 14 of 46 slices shown]
[im 1/46]
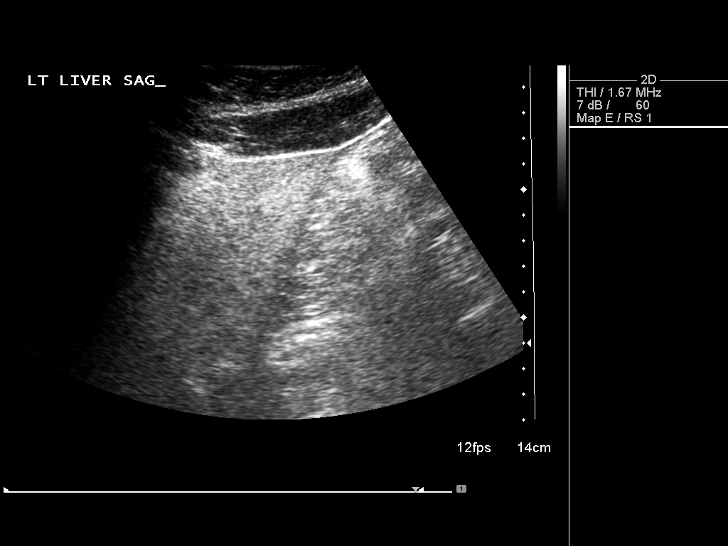
[im 4/46]
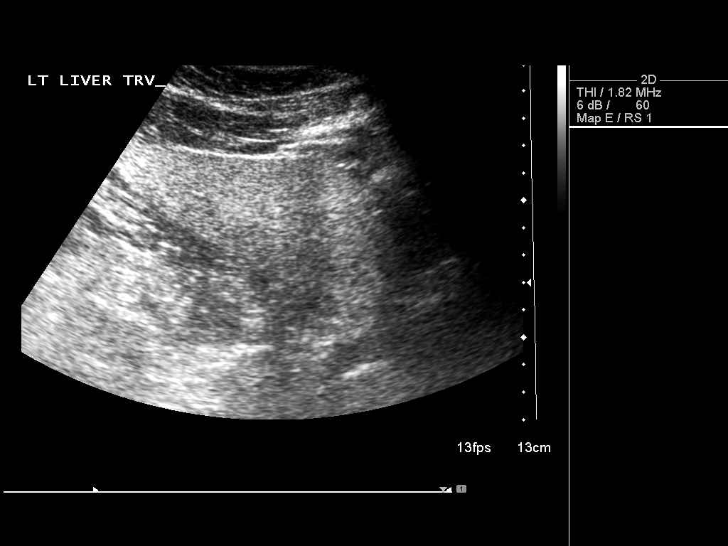
[im 8/46]
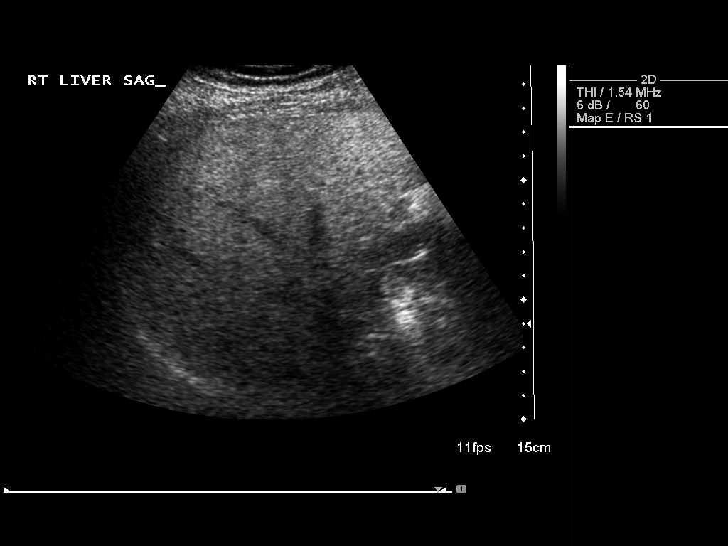
[im 12/46]
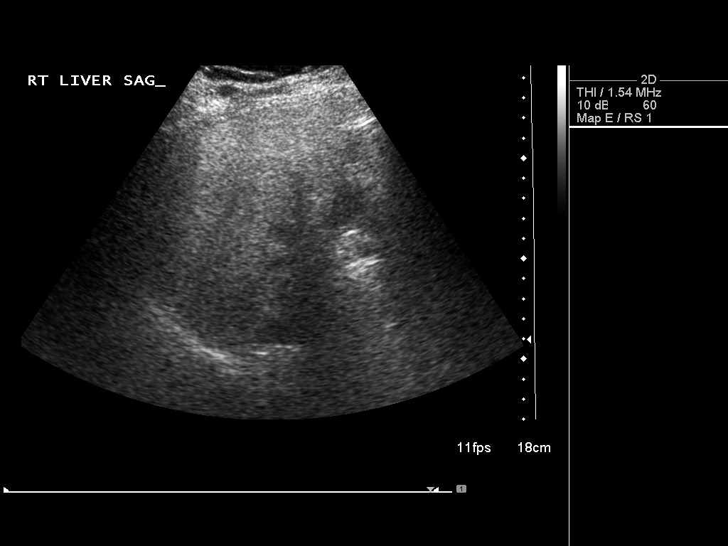
[im 16/46]
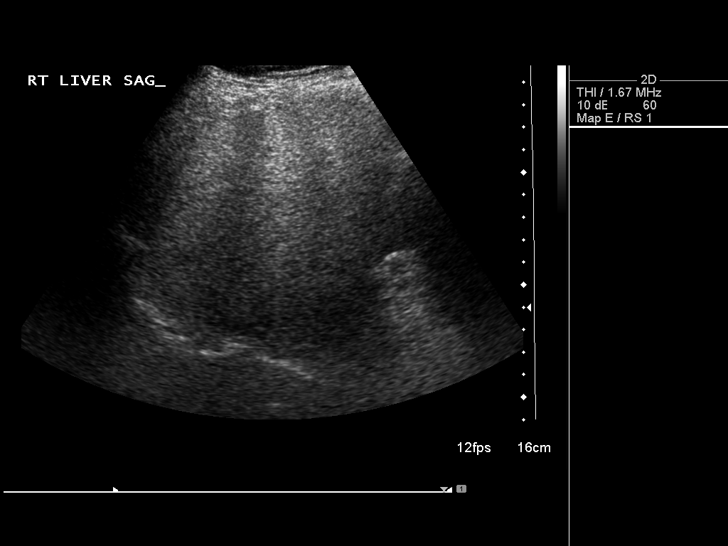
[im 17/46]
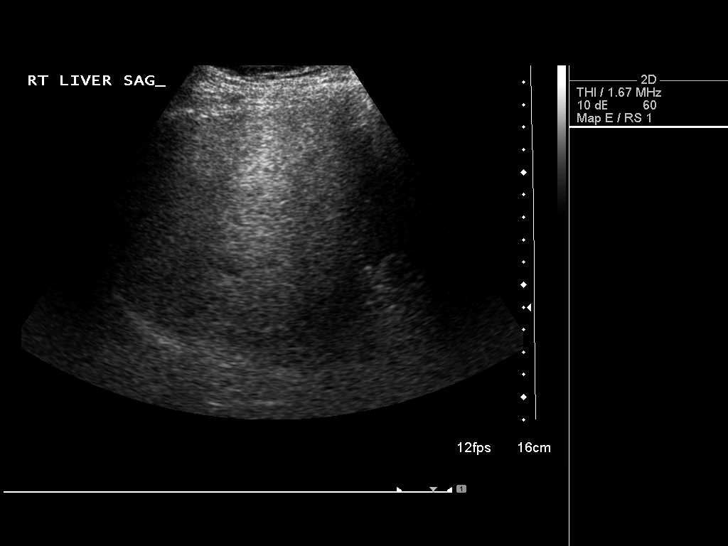
[im 21/46]
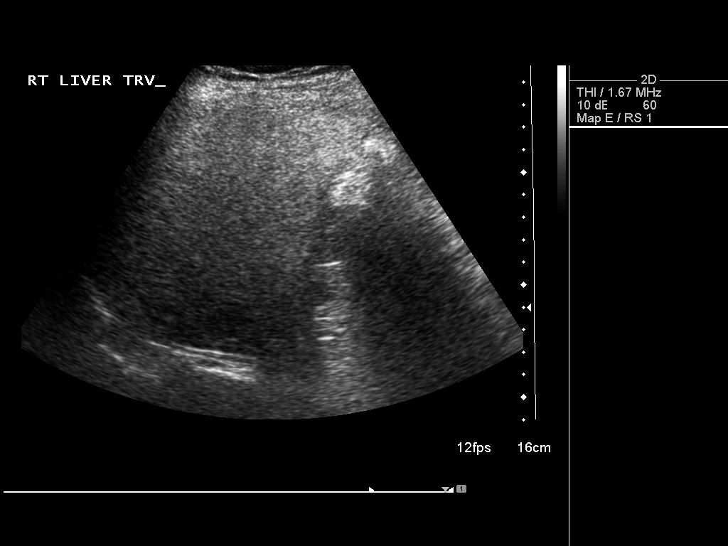
[im 25/46]
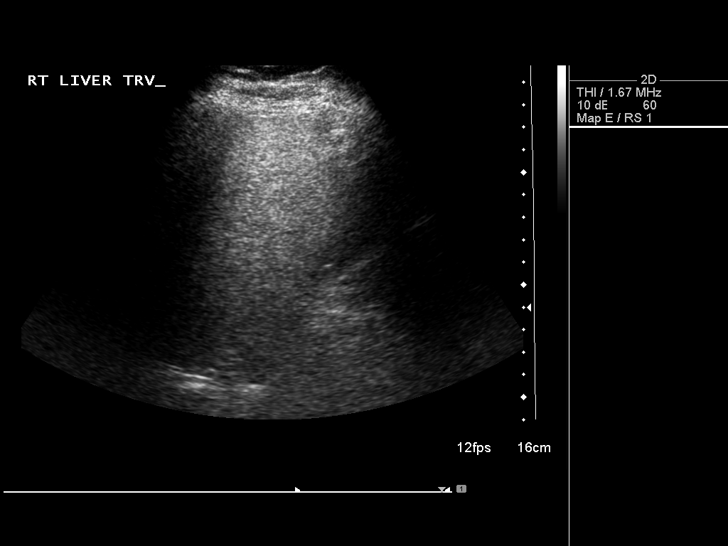
[im 29/46]
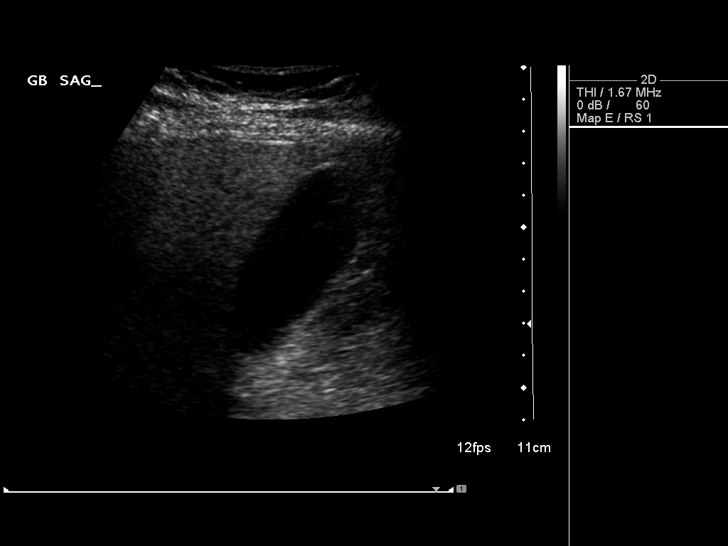
[im 31/46]
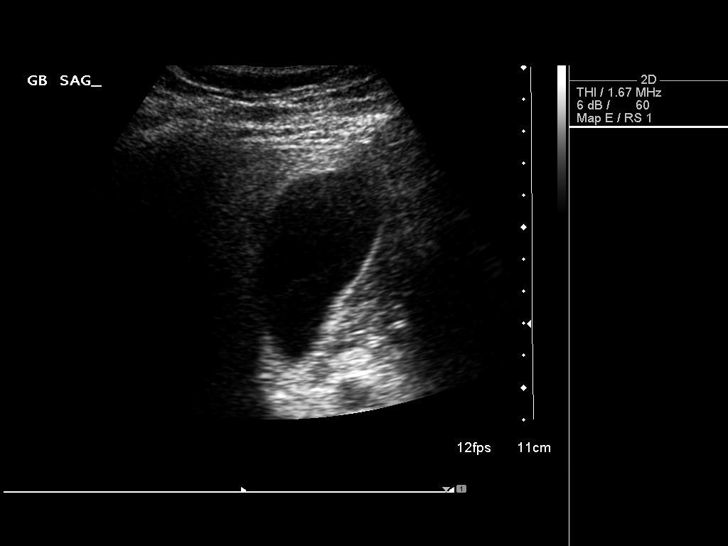
[im 34/46]
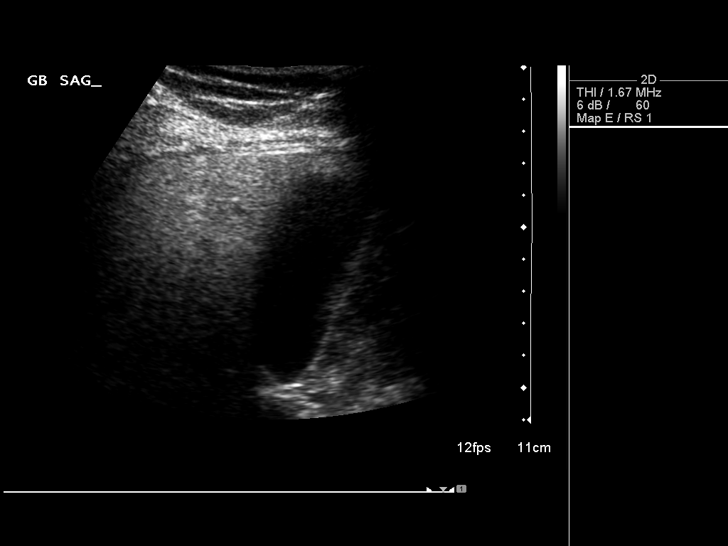
[im 38/46]
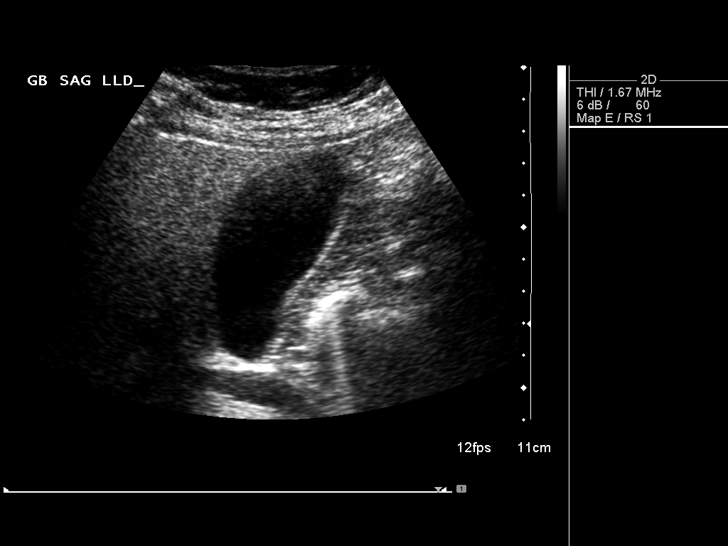
[im 42/46]
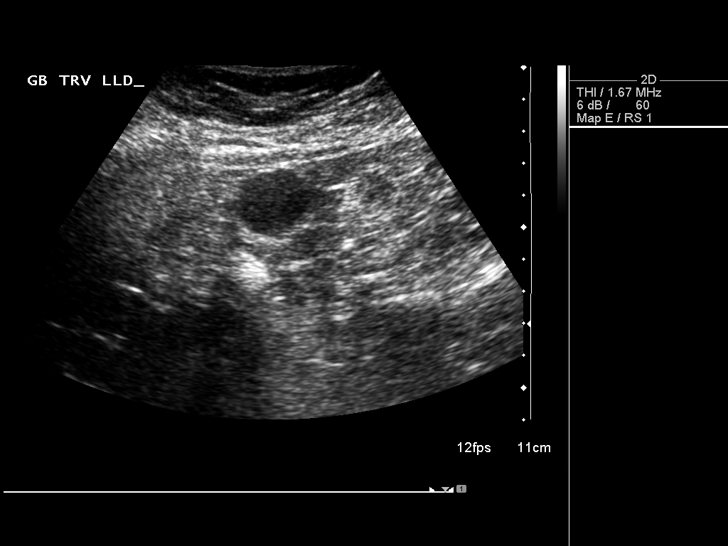
[im 46/46]
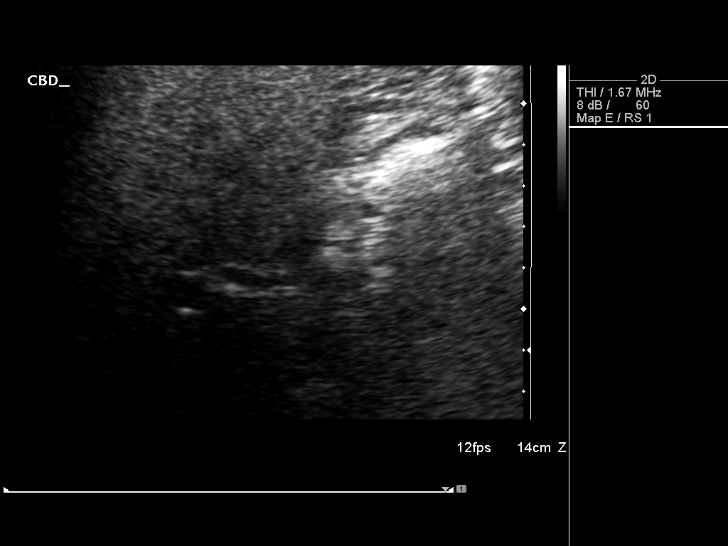

[14 of 25 positions shown; findings below may reference images not displayed]

FINDINGS: Gallbladder:  The gallbladder is visualized and no gallstones are
noted.  There is no pain over the gallbladder with compression.

Common bile duct:  The common bile duct is normal measuring 3.9 mm
in diameter.

Liver:  The liver is diffusely echogenic consistent with fatty
infiltration.  No focal abnormality is seen.
IMPRESSION: 1.  Echogenic liver parenchyma consistent with fatty infiltration.
2.  No gallstones.

## 2015-04-11 ENCOUNTER — Encounter: Payer: Self-pay | Admitting: Family Medicine

## 2015-04-16 ENCOUNTER — Encounter: Payer: Self-pay | Admitting: Family Medicine
